# Patient Record
Sex: Female | Born: 1980 | Race: White | Hispanic: No | Marital: Single
Health system: Southern US, Community
[De-identification: ages and names within clinical notes are randomized; demographics above are authoritative.]

## PROBLEM LIST (undated history)

## (undated) ENCOUNTER — Inpatient Hospital Stay (HOSPITAL_COMMUNITY): Payer: Self-pay

---

## 2001-03-08 ENCOUNTER — Encounter: Payer: Self-pay | Admitting: Emergency Medicine

## 2001-03-08 ENCOUNTER — Emergency Department (HOSPITAL_COMMUNITY): Admission: EM | Admit: 2001-03-08 | Discharge: 2001-03-09 | Payer: Self-pay | Admitting: Emergency Medicine

## 2001-05-09 ENCOUNTER — Emergency Department (HOSPITAL_COMMUNITY): Admission: EM | Admit: 2001-05-09 | Discharge: 2001-05-09 | Payer: Self-pay

## 2001-10-24 ENCOUNTER — Other Ambulatory Visit: Admission: RE | Admit: 2001-10-24 | Discharge: 2001-10-24 | Payer: Self-pay | Admitting: Obstetrics and Gynecology

## 2001-10-24 ENCOUNTER — Other Ambulatory Visit: Admission: RE | Admit: 2001-10-24 | Discharge: 2001-10-24 | Payer: Self-pay | Admitting: *Deleted

## 2002-04-05 ENCOUNTER — Inpatient Hospital Stay (HOSPITAL_COMMUNITY): Admission: AD | Admit: 2002-04-05 | Discharge: 2002-04-07 | Payer: Self-pay | Admitting: *Deleted

## 2002-07-16 ENCOUNTER — Encounter: Admission: RE | Admit: 2002-07-16 | Discharge: 2002-07-24 | Payer: Self-pay | Admitting: Family Medicine

## 2003-05-19 ENCOUNTER — Inpatient Hospital Stay (HOSPITAL_COMMUNITY): Admission: AD | Admit: 2003-05-19 | Discharge: 2003-05-19 | Payer: Self-pay | Admitting: *Deleted

## 2003-06-27 ENCOUNTER — Emergency Department (HOSPITAL_COMMUNITY): Admission: EM | Admit: 2003-06-27 | Discharge: 2003-06-27 | Payer: Self-pay | Admitting: Emergency Medicine

## 2003-08-06 ENCOUNTER — Inpatient Hospital Stay (HOSPITAL_COMMUNITY): Admission: AD | Admit: 2003-08-06 | Discharge: 2003-08-06 | Payer: Self-pay | Admitting: Family Medicine

## 2003-08-31 ENCOUNTER — Other Ambulatory Visit: Admission: RE | Admit: 2003-08-31 | Discharge: 2003-08-31 | Payer: Self-pay | Admitting: Obstetrics and Gynecology

## 2004-01-02 ENCOUNTER — Inpatient Hospital Stay (HOSPITAL_COMMUNITY): Admission: AD | Admit: 2004-01-02 | Discharge: 2004-01-02 | Payer: Self-pay | Admitting: Obstetrics and Gynecology

## 2004-01-27 ENCOUNTER — Inpatient Hospital Stay (HOSPITAL_COMMUNITY): Admission: AD | Admit: 2004-01-27 | Discharge: 2004-01-27 | Payer: Self-pay | Admitting: Obstetrics and Gynecology

## 2004-02-01 ENCOUNTER — Inpatient Hospital Stay (HOSPITAL_COMMUNITY): Admission: AD | Admit: 2004-02-01 | Discharge: 2004-02-02 | Payer: Self-pay | Admitting: Obstetrics and Gynecology

## 2011-08-05 ENCOUNTER — Emergency Department (HOSPITAL_COMMUNITY)
Admission: EM | Admit: 2011-08-05 | Discharge: 2011-08-05 | Disposition: A | Discharge status: Home / Self Care | Payer: Self-pay | Attending: Emergency Medicine | Admitting: Emergency Medicine

## 2011-08-05 ENCOUNTER — Encounter (HOSPITAL_COMMUNITY): Payer: Self-pay | Admitting: *Deleted

## 2011-08-05 ENCOUNTER — Emergency Department (HOSPITAL_COMMUNITY): Payer: Self-pay

## 2011-08-05 DIAGNOSIS — S61209A Unspecified open wound of unspecified finger without damage to nail, initial encounter: Secondary | ICD-10-CM | POA: Insufficient documentation

## 2011-08-05 DIAGNOSIS — S6000XA Contusion of unspecified finger without damage to nail, initial encounter: Secondary | ICD-10-CM | POA: Insufficient documentation

## 2011-08-05 DIAGNOSIS — F17200 Nicotine dependence, unspecified, uncomplicated: Secondary | ICD-10-CM | POA: Insufficient documentation

## 2011-08-05 DIAGNOSIS — W208XXA Other cause of strike by thrown, projected or falling object, initial encounter: Secondary | ICD-10-CM | POA: Insufficient documentation

## 2011-08-05 DIAGNOSIS — S60029A Contusion of unspecified index finger without damage to nail, initial encounter: Secondary | ICD-10-CM

## 2011-08-05 DIAGNOSIS — S61219A Laceration without foreign body of unspecified finger without damage to nail, initial encounter: Secondary | ICD-10-CM

## 2011-08-05 NOTE — ED Provider Notes (Signed)
History     CSN: [id number]  Arrival date & time 08/05/11  1342   First MD Initiated Contact with Patient 08/05/11 1505      Chief Complaint  Patient presents with  . Finger Injury    (Consider location/radiation/quality/duration/timing/severity/associated sxs/prior treatment) HPI Comments: Patient states she was loading wood into a furnace, when a piece of wood fell and injured the right index finger. She has had pain and throbbing of the tip of the right index finger since that time. Patient tried Naprosyn for assistance with pain but this was unsuccessful. She has pain with attempting to be in the index finger. There been no previous procedures involving this finger.  The history is provided by the patient.    History reviewed. No pertinent past medical history.  History reviewed. No pertinent past surgical history.  No family history on file.  History  Substance Use Topics  . Smoking status: Current Everyday Smoker -- 0.5 packs/day for 10 years    Types: Cigarettes  . Smokeless tobacco: Not on file  . Alcohol Use: No    OB History    Grav Para Term Preterm Abortions TAB SAB Ect Mult Living   3 3              Review of Systems  Constitutional: Negative for activity change.       All ROS Neg except as noted in HPI  HENT: Negative for nosebleeds and neck pain.   Eyes: Negative for photophobia and discharge.  Respiratory: Negative for cough, shortness of breath and wheezing.   Cardiovascular: Negative for chest pain and palpitations.  Gastrointestinal: Negative for abdominal pain and blood in stool.  Genitourinary: Negative for dysuria, frequency and hematuria.  Musculoskeletal: Negative for back pain and arthralgias.  Skin: Negative.   Neurological: Negative for dizziness, seizures and speech difficulty.  Psychiatric/Behavioral: Negative for hallucinations and confusion.    Allergies  Review of patient's allergies indicates no known allergies.  Home  Medications   Current Outpatient Rx  Name Route Sig Dispense Refill  . NAPROXEN SODIUM 220 MG PO TABS Oral Take 220 mg by mouth every 8 (eight) hours as needed. For headache      BP 114/75  Pulse 115  Temp(Src) 98.5 F (36.9 C) (Oral)  Resp 18  Ht 5\' 6"  (1.676 m)  Wt 123 lb (55.792 kg)  BMI 19.85 kg/m2  SpO2 100%  LMP 07/25/2011  Physical Exam  Nursing note and vitals reviewed. Constitutional: She is oriented to person, place, and time. She appears well-developed and well-nourished.  Non-toxic appearance.  HENT:  Head: Normocephalic.  Right Ear: Tympanic membrane and external ear normal.  Left Ear: Tympanic membrane and external ear normal.  Eyes: EOM and lids are normal. Pupils are equal, round, and reactive to light.  Neck: Normal range of motion. Neck supple. Carotid bruit is not present.  Cardiovascular: Normal rate, regular rhythm, normal heart sounds, intact distal pulses and normal pulses.   Pulmonary/Chest: Breath sounds normal. No respiratory distress.  Abdominal: Soft. Bowel sounds are normal. There is no tenderness. There is no guarding.  Musculoskeletal: Normal range of motion.       Patient has decreased range of motion of the distal right index finger due to pain. There is full range of motion of the MP joint, wrist, elbow, and right shoulder. There is good capillary refill of the index finger. There is a shallow laceration on the palmar surface of the mid right index finger, bleeding is  controlled.  Lymphadenopathy:       Head (right side): No submandibular adenopathy present.       Head (left side): No submandibular adenopathy present.    She has no cervical adenopathy.  Neurological: She is alert and oriented to person, place, and time. She has normal strength. No cranial nerve deficit or sensory deficit.  Skin: Skin is warm and dry.  Psychiatric: She has a normal mood and affect. Her speech is normal.    ED Course  Procedures (including critical care  time)  Labs Reviewed - No data to display Dg Finger Index Right  08/05/2011  *RADIOLOGY REPORT*  Clinical Data: 31 year old female with right index finger pain and swelling following injury.  RIGHT INDEX FINGER 2+V  Comparison: None  Findings: No evidence of acute fracture, subluxation or dislocation identified.  No radio-opaque foreign bodies are present.  No focal bony lesions are noted.  The joint spaces are unremarkable.  IMPRESSION: No bony abnormalities.  Original Report Authenticated By: Rosendo Gros, M.D.     1. Contusion of index finger   2. Laceration of finger       MDM  I have reviewed nursing notes, vital signs, and all appropriate lab and imaging results for this patient. The x-ray of the right index finger is negative for fracture or dislocation. The patient is fitted with a finger splint. Patient is to continue her naproxen. He is to see orthopedics if finger is not improving. The laceration to the palmar surface of the right index finger is a shallow flap-type laceration. It is not a candidate for suture repair at this time. Sterile bandage applied.       Kathie Dike, PA 08/05/11 1516  Kathie Dike, PA 08/05/11 1517

## 2011-08-05 NOTE — ED Notes (Signed)
Pt states "crushed rt index finger while putting wood in furnace"  Pt's rt index finger is bruised with minimal swelling and decreased range of motion. Pt also has a dime sized open laceration on finger. No s/s of infection noted. Bleeding under control.

## 2011-08-05 NOTE — ED Notes (Signed)
Crush injury to Rt index finger.

## 2011-08-05 NOTE — Discharge Instructions (Signed)
Your x-ray is negative for fracture or dislocation. Please use the finger splint for the next 7-10 days. Please see the orthopedist listed above if you begin to have problems with your finger.Contusion A contusion is a deep bruise. Contusions happen when an injury causes bleeding under the skin. Signs of bruising include pain, puffiness (swelling), and discolored skin. The contusion may turn blue, purple, or yellow. HOME CARE   Put ice on the injured area.   Put ice in a plastic bag.   Place a towel between your skin and the bag.   Leave the ice on for 15 to 20 minutes, 3 to 4 times a day.   Only take medicine as told by your doctor.   Rest the injured area.   If possible, raise (elevate) the injured area to lessen puffiness.  GET HELP RIGHT AWAY IF:   You have more bruising or puffiness.   You have pain that is getting worse.   Your puffiness or pain is not helped by medicine.  MAKE SURE YOU:   Understand these instructions.   Will watch your condition.   Will get help right away if you are not doing well or get worse.  Document Released: 11/01/2007 Document Revised: 05/04/2011 Document Reviewed: 03/20/2011 [facility] Patient Information 2012 [place], [place].

## 2011-08-05 NOTE — ED Provider Notes (Signed)
Medical screening examination/treatment/procedure(s) were performed by non-physician practitioner and as supervising physician I was immediately available for consultation/collaboration.  Hurman Horn, MD 08/05/11 [phone]

## 2013-02-12 IMAGING — CR DG FINGER INDEX 2+V*R*
2 series · 2 of 2 positions shown · non-contrast
Comparison: None

CLINICAL DATA: 30-year-old female with right index finger pain and
swelling following injury.

RIGHT INDEX FINGER 2+V

[view not recorded (1 of 2)]
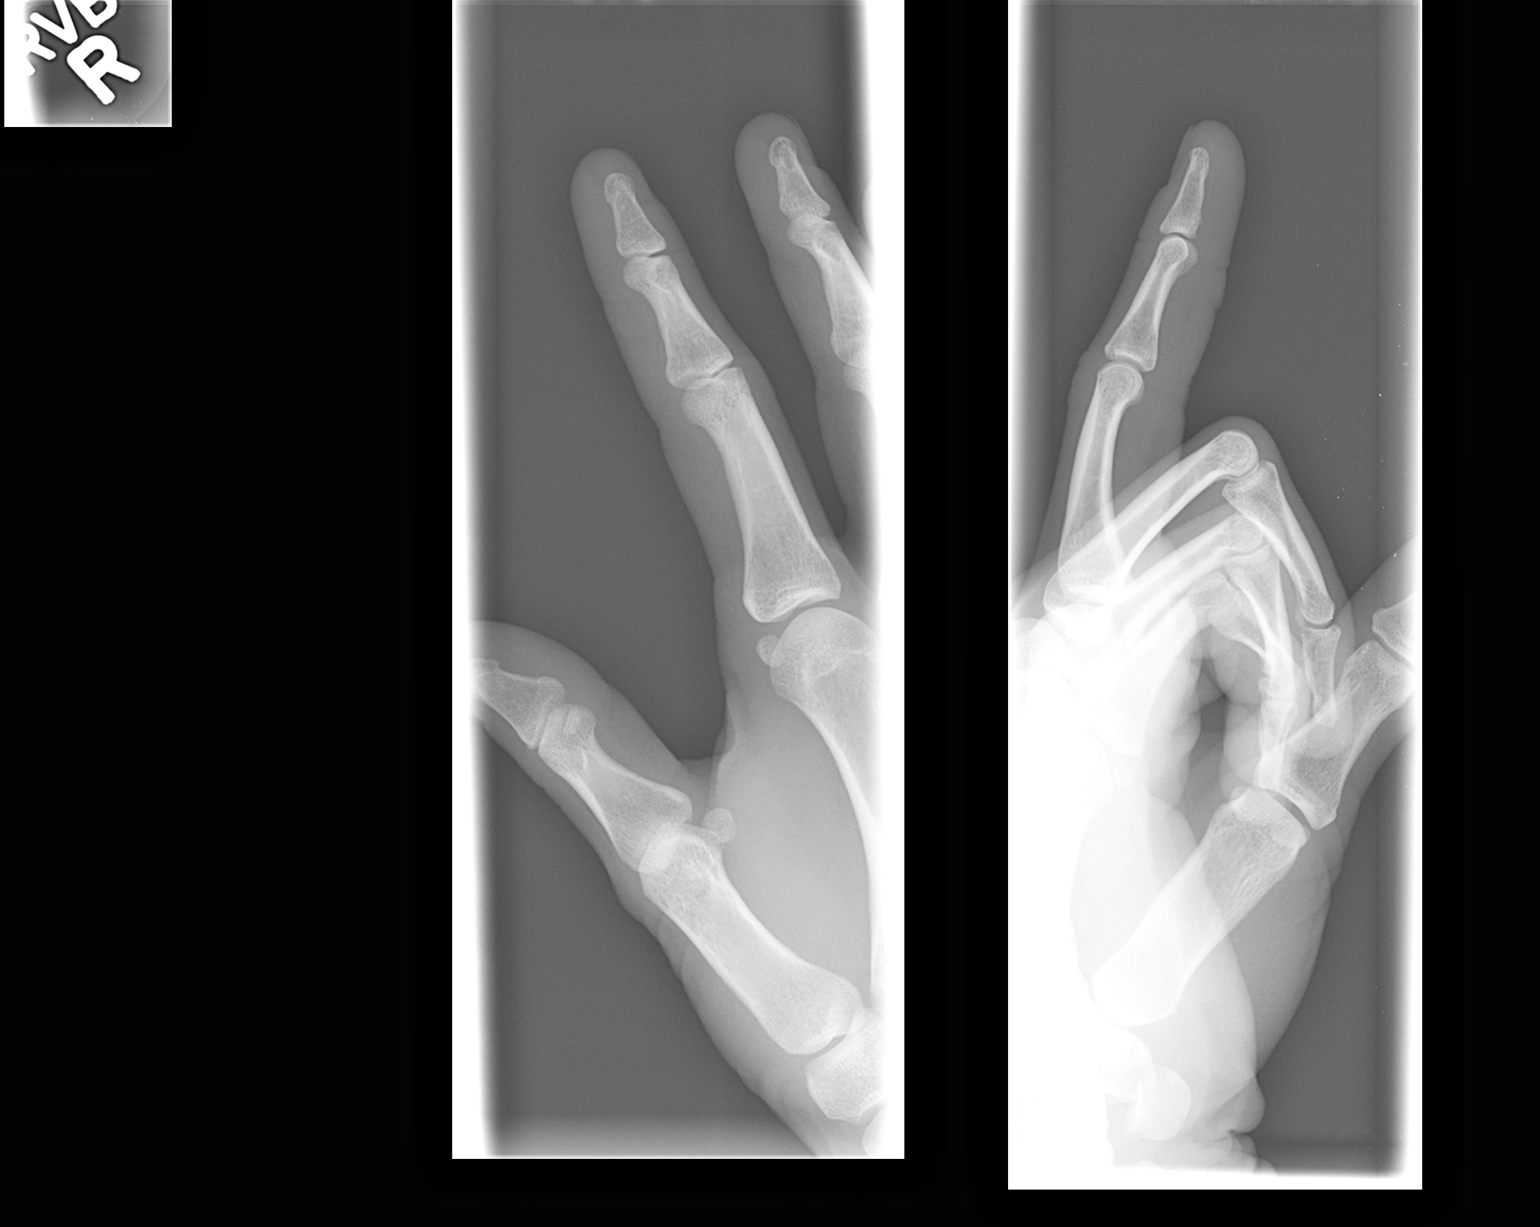

[view not recorded (2 of 2)]
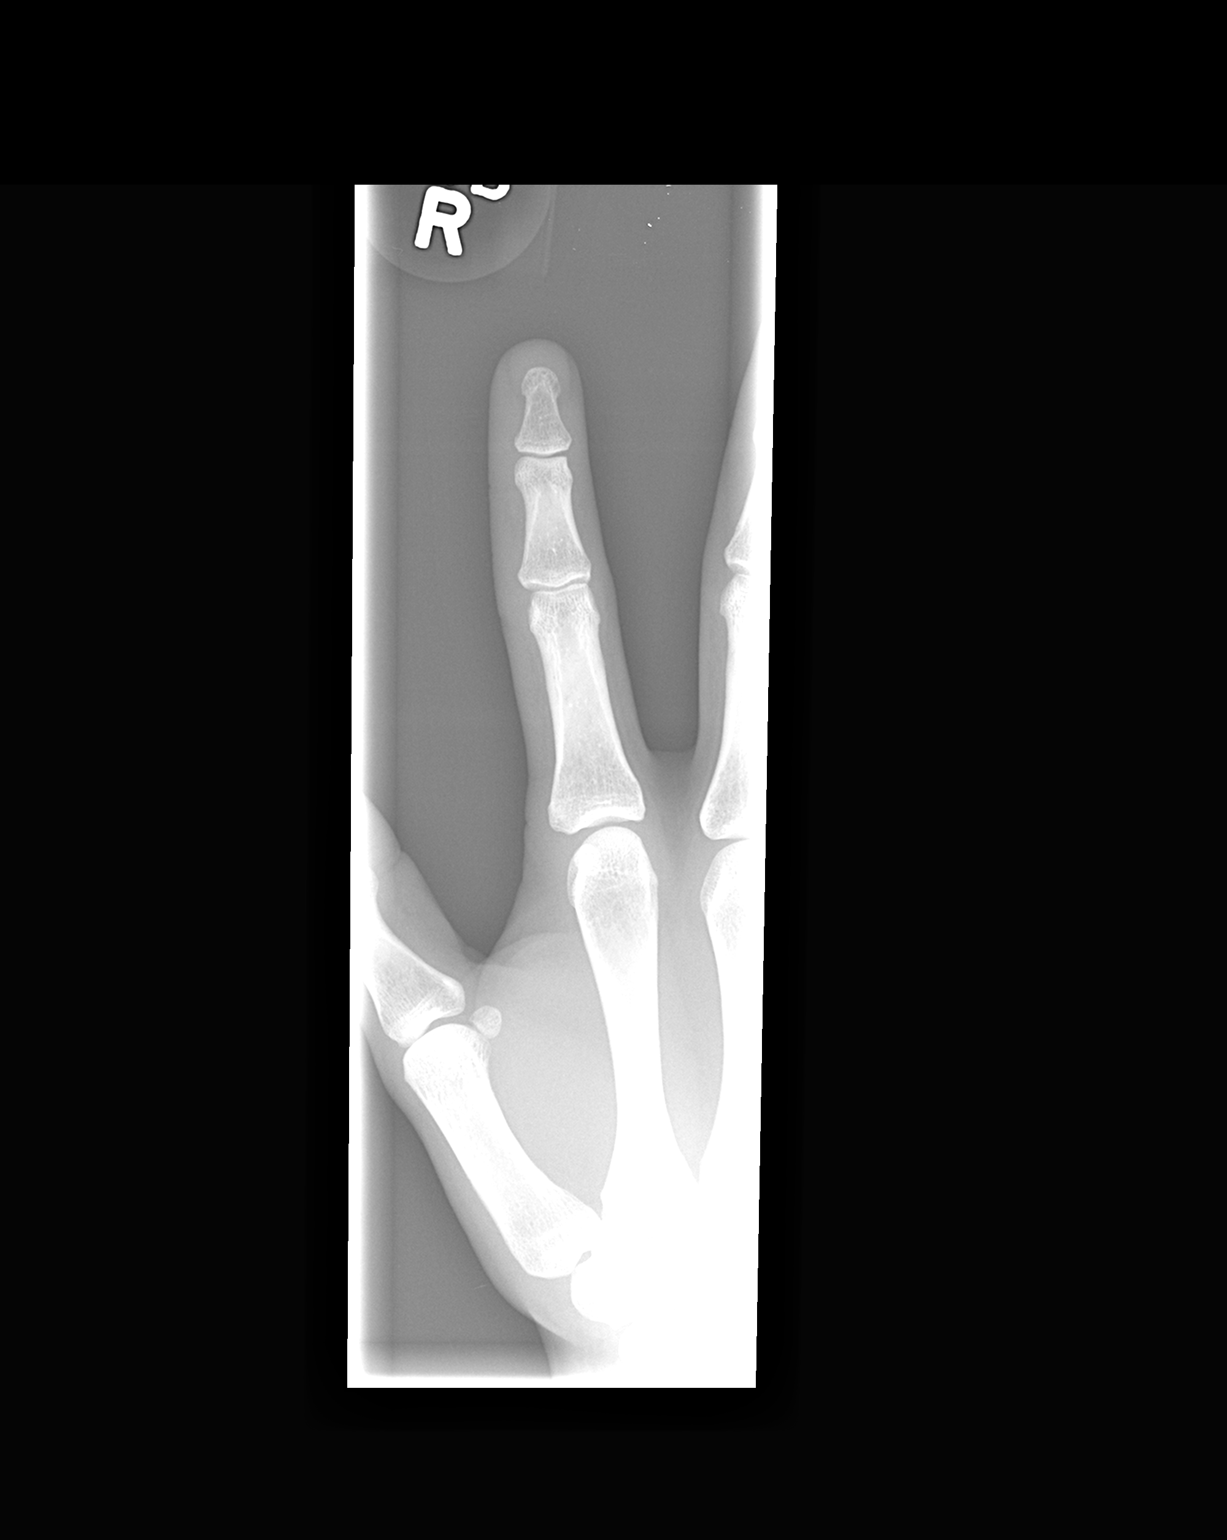

[2 of 2 positions shown; findings below may reference images not displayed]

FINDINGS: No evidence of acute fracture, subluxation or dislocation
identified.

No radio-opaque foreign bodies are present.

No focal bony lesions are noted.

The joint spaces are unremarkable.
IMPRESSION: No bony abnormalities.

## 2013-04-26 ENCOUNTER — Encounter (HOSPITAL_COMMUNITY): Payer: Self-pay | Admitting: Emergency Medicine

## 2013-04-26 ENCOUNTER — Emergency Department (HOSPITAL_COMMUNITY)
Admission: EM | Admit: 2013-04-26 | Discharge: 2013-04-26 | Disposition: A | Discharge status: Home / Self Care | Payer: Self-pay | Attending: Emergency Medicine | Admitting: Emergency Medicine

## 2013-04-26 DIAGNOSIS — Z792 Long term (current) use of antibiotics: Secondary | ICD-10-CM | POA: Insufficient documentation

## 2013-04-26 DIAGNOSIS — K047 Periapical abscess without sinus: Secondary | ICD-10-CM | POA: Insufficient documentation

## 2013-04-26 DIAGNOSIS — F17200 Nicotine dependence, unspecified, uncomplicated: Secondary | ICD-10-CM | POA: Insufficient documentation

## 2013-04-26 DIAGNOSIS — K029 Dental caries, unspecified: Secondary | ICD-10-CM | POA: Insufficient documentation

## 2013-04-26 MED ORDER — TRAMADOL HCL 50 MG PO TABS: 50.0000 mg | ORAL_TABLET | Freq: Four times a day (QID) | ORAL | Status: DC | PRN

## 2013-04-26 MED ORDER — AMOXICILLIN 500 MG PO CAPS: 500.0000 mg | ORAL_CAPSULE | Freq: Three times a day (TID) | ORAL | Status: DC

## 2013-04-26 NOTE — ED Notes (Signed)
Pain in right lower jaw, needs work note

## 2013-04-28 NOTE — ED Provider Notes (Signed)
CSN: [id number]     Arrival date & time 04/26/13  1359 History   First MD Initiated Contact with Patient 04/26/13 1421     Chief Complaint  Patient presents with  . Dental Pain   (Consider location/radiation/quality/duration/timing/severity/associated sxs/prior Treatment) HPI Comments: Joyce Roman is a 32 y.o. Female with a 2 day history of dental pain and gingival swelling.   The patient has a history of a deepening cavity in the tooth involved.  There has been no fevers,  Chills, nausea or vomiting, also no complaint of difficulty swallowing,  Although chewing makes pain worse.  The patient has tried naproxen without relief of symptoms.         The history is provided by the patient.    History reviewed. No pertinent past medical history. History reviewed. No pertinent past surgical history. No family history on file. History  Substance Use Topics  . Smoking status: Current Every Day Smoker -- 0.50 packs/day for 10 years    Types: Cigarettes  . Smokeless tobacco: Not on file  . Alcohol Use: No   OB History   Grav Para Term Preterm Abortions TAB SAB Ect Mult Living   3 3             Review of Systems  Constitutional: Negative for fever.  HENT: Positive for dental problem. Negative for facial swelling and sore throat.   Respiratory: Negative for shortness of breath.   Musculoskeletal: Negative for neck pain and neck stiffness.    Allergies  Review of patient's allergies indicates no known allergies.  Home Medications   Current Outpatient Rx  Name  Route  Sig  Dispense  Refill  . amoxicillin (AMOXIL) 500 MG capsule   Oral   Take 1 capsule (500 mg total) by mouth 3 (three) times daily.   30 capsule   0   . naproxen sodium (ANAPROX) 220 MG tablet   Oral   Take 220 mg by mouth every 8 (eight) hours as needed. For headache         . traMADol (ULTRAM) 50 MG tablet   Oral   Take 1 tablet (50 mg total) by mouth every 6 (six) hours as needed.   15 tablet    0    BP 100/64  Pulse 87  Temp(Src) 98.3 F (36.8 C)  Resp 20  Ht 5\' 6"  (1.676 m)  Wt 130 lb (58.968 kg)  BMI 20.99 kg/m2  SpO2 98%  LMP 04/05/2013 Physical Exam  Constitutional: She is oriented to person, place, and time. She appears well-developed and well-nourished. No distress.  HENT:  Head: Normocephalic and atraumatic.  Right Ear: Tympanic membrane and external ear normal.  Left Ear: Tympanic membrane and external ear normal.  Nose: Nose normal.  Mouth/Throat: Oropharynx is clear and moist and mucous membranes are normal. No oral lesions. No trismus in the jaw. Dental abscesses and dental caries present.    Deep cavity right lower 2nd molar.  Surrounding gingival erythema without edema.  No facial edema.  Eyes: Conjunctivae are normal.  Neck: Normal range of motion. Neck supple.  Cardiovascular: Normal rate and normal heart sounds.   Pulmonary/Chest: Effort normal.  Abdominal: She exhibits no distension.  Musculoskeletal: Normal range of motion.  Lymphadenopathy:       Head (right side): Submental adenopathy present.    She has no cervical adenopathy.  Neurological: She is alert and oriented to person, place, and time.  Skin: Skin is warm and dry. No erythema.  Psychiatric: She has a normal mood and affect.    ED Course  Procedures (including critical care time) Labs Review Labs Reviewed - No data to display Imaging Review No results found.  EKG Interpretation   None       MDM   1. Dental abscess    Pt prescribed amoxil, tramadol, encouraged f/u with dentist, referrals given.  The patient appears reasonably screened and/or stabilized for discharge and I doubt any other medical condition or other [facility] requiring further screening, evaluation, or treatment in the ED at this time prior to discharge.     Burgess Amor, PA-C 04/28/13 1242

## 2013-04-29 NOTE — ED Provider Notes (Signed)
Medical screening examination/treatment/procedure(s) were performed by non-physician practitioner and as supervising physician I was immediately available for consultation/collaboration.     Geoffery Lyons, MD 04/29/13 [phone]

## 2013-09-10 ENCOUNTER — Encounter (HOSPITAL_COMMUNITY): Payer: Self-pay | Admitting: Emergency Medicine

## 2013-09-10 ENCOUNTER — Emergency Department (HOSPITAL_COMMUNITY)
Admission: EM | Admit: 2013-09-10 | Discharge: 2013-09-10 | Disposition: A | Discharge status: Home / Self Care | Payer: Self-pay | Attending: Emergency Medicine | Admitting: Emergency Medicine

## 2013-09-10 DIAGNOSIS — Z792 Long term (current) use of antibiotics: Secondary | ICD-10-CM | POA: Insufficient documentation

## 2013-09-10 DIAGNOSIS — S90569A Insect bite (nonvenomous), unspecified ankle, initial encounter: Secondary | ICD-10-CM | POA: Insufficient documentation

## 2013-09-10 DIAGNOSIS — Y929 Unspecified place or not applicable: Secondary | ICD-10-CM | POA: Insufficient documentation

## 2013-09-10 DIAGNOSIS — W57XXXA Bitten or stung by nonvenomous insect and other nonvenomous arthropods, initial encounter: Secondary | ICD-10-CM | POA: Insufficient documentation

## 2013-09-10 DIAGNOSIS — Y939 Activity, unspecified: Secondary | ICD-10-CM | POA: Insufficient documentation

## 2013-09-10 DIAGNOSIS — F17200 Nicotine dependence, unspecified, uncomplicated: Secondary | ICD-10-CM | POA: Insufficient documentation

## 2013-09-10 DIAGNOSIS — Z79899 Other long term (current) drug therapy: Secondary | ICD-10-CM | POA: Insufficient documentation

## 2013-09-10 DIAGNOSIS — S40269A Insect bite (nonvenomous) of unspecified shoulder, initial encounter: Secondary | ICD-10-CM | POA: Insufficient documentation

## 2013-09-10 MED ORDER — SULFAMETHOXAZOLE-TRIMETHOPRIM 800-160 MG PO TABS: 1.0000 | ORAL_TABLET | Freq: Two times a day (BID) | ORAL | Status: DC

## 2013-09-10 MED ORDER — TRIAMCINOLONE ACETONIDE 0.1 % EX CREA: 1.0000 "application " | TOPICAL_CREAM | Freq: Three times a day (TID) | CUTANEOUS | Status: DC

## 2013-09-10 NOTE — Discharge Instructions (Signed)
Insect Bite °Mosquitoes, flies, fleas, bedbugs, and other insects can bite. Insect bites are different from insect stings. The bite may be red, puffy (swollen), and itchy for 2 to 4 days. Most bites get better on their own. °HOME CARE  °· Do not scratch the bite. °· Keep the bite clean and dry. Wash the bite with soap and water. °· Put ice on the bite. °· Put ice in a plastic bag. °· Place a towel between your skin and the bag. °· Leave the ice on for 20 minutes, 4 times a day. Do this for the first 2 to 3 days, or as told by your doctor. °· You may use medicated lotions or creams to lessen itching as told by your doctor. °· Only take medicines as told by your doctor. °· If you are given medicines (antibiotics), take them as told. Finish them even if you start to feel better. °You may need a tetanus shot if: °· You cannot remember when you had your last tetanus shot. °· You have never had a tetanus shot. °· The injury broke your skin. °If you need a tetanus shot and you choose not to have one, you may get tetanus. Sickness from tetanus can be serious. °GET HELP RIGHT AWAY IF:  °· You have more pain, redness, or puffiness. °· You see a red line on the skin coming from the bite. °· You have a fever. °· You have joint pain. °· You have a headache or neck pain. °· You feel weak. °· You have a rash. °· You have chest pain, or you are short of breath. °· You have belly (abdominal) pain. °· You feel sick to your stomach (nauseous) or throw up (vomit). °· You feel very tired or sleepy. °MAKE SURE YOU:  °· Understand these instructions. °· Will watch your condition. °· Will get help right away if you are not doing well or get worse. °Document Released: 05/12/2000 Document Revised: 08/07/2011 Document Reviewed: 12/14/2010 °[employer]® Patient Information ©2014 [employer]. ° °

## 2013-09-10 NOTE — ED Notes (Signed)
Pt states noticed bug bite on rt flank area yesterday, started small and now it appears red, hot, swollen and approximately 3.5 inches across in an uneven circular shape. Pt noticed another spot on rt upper inside of right arm.

## 2013-09-12 NOTE — ED Provider Notes (Signed)
CSN: [id number]     Arrival date & time 09/10/13  1408 History   First MD Initiated Contact with Patient 09/10/13 1739     Chief Complaint  Patient presents with  . Insect Bite     (Consider location/radiation/quality/duration/timing/severity/associated sxs/prior Treatment) HPI Comments: Joyce Roman is a 33 y.o. female who presents to the Emergency Department complaining of itching and redness to the right anterior hip and inner aspect of the right upper arm.  Sx's began one day prior to ED arrival.  Patient believes she may have been bitten by an insect although she does not recall anything.  She states the redness to her hip area is worsening.  She has applied hydrocortisone cream w/o relief.  She denies pain, fever, chills, nausea or vomiting.    The history is provided by the patient.    History reviewed. No pertinent past medical history. History reviewed. No pertinent past surgical history. History reviewed. No pertinent family history. History  Substance Use Topics  . Smoking status: Current Every Day Smoker -- 0.50 packs/day for 10 years    Types: Cigarettes  . Smokeless tobacco: Not on file  . Alcohol Use: No   OB History   Grav Para Term Preterm Abortions TAB SAB Ect Mult Living   3 3             Review of Systems  Constitutional: Negative for fever, chills, activity change and appetite change.  HENT: Negative for facial swelling, sore throat and trouble swallowing.   Respiratory: Negative for chest tightness, shortness of breath and wheezing.   Musculoskeletal: Negative for neck pain and neck stiffness.  Skin: Positive for color change and rash. Negative for wound.  Neurological: Negative for dizziness, weakness, numbness and headaches.  All other systems reviewed and are negative.     Allergies  Review of patient's allergies indicates no known allergies.  Home Medications   Prior to Admission medications   Medication Sig Start Date End Date Taking?  Authorizing Provider  amoxicillin (AMOXIL) 500 MG capsule Take 1 capsule (500 mg total) by mouth 3 (three) times daily. 04/26/13   Burgess AmorJulie Idol, PA-C  naproxen sodium (ANAPROX) 220 MG tablet Take 220 mg by mouth every 8 (eight) hours as needed. For headache    Historical Provider, MD  sulfamethoxazole-trimethoprim (SEPTRA DS) 800-160 MG per tablet Take 1 tablet by mouth 2 (two) times daily. For 10 days 09/10/13   Kevork Joyce L. Darrik Richman, PA-C  traMADol (ULTRAM) 50 MG tablet Take 1 tablet (50 mg total) by mouth every 6 (six) hours as needed. 04/26/13   Burgess AmorJulie Idol, PA-C  triamcinolone cream (KENALOG) 0.1 % Apply 1 application topically 3 (three) times daily. 09/10/13   Gamble Enderle L. Galileo Colello, PA-C   BP 115/72  Pulse 75  Temp(Src) 98.3 F (36.8 C) (Oral)  Resp 18  Ht 5\' 6"  (1.676 m)  Wt 130 lb (58.968 kg)  BMI 20.99 kg/m2  SpO2 100% Physical Exam  Nursing note and vitals reviewed. Constitutional: She is oriented to person, place, and time. She appears well-developed and well-nourished. No distress.  HENT:  Head: Normocephalic and atraumatic.  Mouth/Throat: Oropharynx is clear and moist.  Neck: Normal range of motion. Neck supple.  Cardiovascular: Normal rate, regular rhythm, normal heart sounds and intact distal pulses.   No murmur heard. Pulmonary/Chest: Effort normal and breath sounds normal. No respiratory distress. She has no wheezes.  Musculoskeletal: She exhibits no edema and no tenderness.  Lymphadenopathy:    She has no  cervical adenopathy.  Neurological: She is alert and oriented to person, place, and time. She exhibits normal muscle tone. Coordination normal.  Skin: Skin is warm. Rash noted. There is erythema.  Localized large area of erythema to the anterior right hip.  Small puncture , slightly blanched area within the center.  No target lesions, petechiae, pustule, or induration.  Similar appearing , quarter sized area to the right upper arm as well.      ED Course  Procedures  (including critical care time) Labs Review Labs Reviewed - No data to display  Imaging Review No results found.   EKG Interpretation None      MDM   Final diagnoses:  Insect bite    Localized erythema to the right hip and upper arm appear c/w insect bites.  Pt agrees to symptomatic treatment with triamcinolone cream and benadryl.  Given significant size of the redness to the hip, I will prescribe bactrim also for possible early developing abscess.  Pt is otherwise well appearing and agrees to plan.  Advised to return if sx's worsen.  Pt stable for d/c    Treyshon Buchanon L. Trisha Mangleriplett, PA-C 09/12/13 2109

## 2013-09-13 NOTE — ED Provider Notes (Signed)
Medical screening examination/treatment/procedure(s) were performed by non-physician practitioner and as supervising physician I was immediately available for consultation/collaboration.   EKG Interpretation None        Rolland PorterMark Jatorian Renault, MD 09/13/13 [phone]

## 2013-11-07 ENCOUNTER — Emergency Department (HOSPITAL_COMMUNITY)
Admission: EM | Admit: 2013-11-07 | Discharge: 2013-11-07 | Disposition: A | Discharge status: Home / Self Care | Payer: Self-pay | Attending: Emergency Medicine | Admitting: Emergency Medicine

## 2013-11-07 ENCOUNTER — Encounter (HOSPITAL_COMMUNITY): Payer: Self-pay | Admitting: Emergency Medicine

## 2013-11-07 DIAGNOSIS — K529 Noninfective gastroenteritis and colitis, unspecified: Secondary | ICD-10-CM

## 2013-11-07 DIAGNOSIS — F17200 Nicotine dependence, unspecified, uncomplicated: Secondary | ICD-10-CM | POA: Insufficient documentation

## 2013-11-07 DIAGNOSIS — Z79899 Other long term (current) drug therapy: Secondary | ICD-10-CM | POA: Insufficient documentation

## 2013-11-07 DIAGNOSIS — K5289 Other specified noninfective gastroenteritis and colitis: Secondary | ICD-10-CM | POA: Insufficient documentation

## 2013-11-07 DIAGNOSIS — Z3202 Encounter for pregnancy test, result negative: Secondary | ICD-10-CM | POA: Insufficient documentation

## 2013-11-07 LAB — CBC WITH DIFFERENTIAL/PLATELET
BASOS ABS: 0.1 10*3/uL (ref 0.0–0.1)
BASOS PCT: 1 % (ref 0–1)
EOS ABS: 0.1 10*3/uL (ref 0.0–0.7)
EOS PCT: 3 % (ref 0–5)
HEMATOCRIT: 42.7 % (ref 36.0–46.0)
HEMOGLOBIN: 15 g/dL (ref 12.0–15.0)
Lymphocytes Relative: 39 % (ref 12–46)
Lymphs Abs: 2 10*3/uL (ref 0.7–4.0)
MCH: 32.1 pg (ref 26.0–34.0)
MCHC: 35.1 g/dL (ref 30.0–36.0)
MCV: 91.4 fL (ref 78.0–100.0)
MONO ABS: 0.4 10*3/uL (ref 0.1–1.0)
MONOS PCT: 7 % (ref 3–12)
Neutro Abs: 2.6 10*3/uL (ref 1.7–7.7)
Neutrophils Relative %: 50 % (ref 43–77)
Platelets: 170 10*3/uL (ref 150–400)
RBC: 4.67 MIL/uL (ref 3.87–5.11)
RDW: 12.4 % (ref 11.5–15.5)
WBC: 5.1 10*3/uL (ref 4.0–10.5)

## 2013-11-07 LAB — COMPREHENSIVE METABOLIC PANEL
ALK PHOS: 65 U/L (ref 39–117)
ALT: 17 U/L (ref 0–35)
AST: 51 U/L — AB (ref 0–37)
Albumin: 3.7 g/dL (ref 3.5–5.2)
BUN: 6 mg/dL (ref 6–23)
CALCIUM: 9.3 mg/dL (ref 8.4–10.5)
CO2: 25 mEq/L (ref 19–32)
Chloride: 101 mEq/L (ref 96–112)
Creatinine, Ser: 0.7 mg/dL (ref 0.50–1.10)
GFR calc non Af Amer: >90 mL/min (ref >90)
GLUCOSE: 86 mg/dL (ref 70–99)
POTASSIUM: 3.1 meq/L — AB (ref 3.7–5.3)
SODIUM: 139 meq/L (ref 137–147)
TOTAL PROTEIN: 6.8 g/dL (ref 6.0–8.3)
Total Bilirubin: 0.2 mg/dL — ABNORMAL LOW (ref 0.3–1.2)

## 2013-11-07 LAB — URINALYSIS, ROUTINE W REFLEX MICROSCOPIC
GLUCOSE, UA: NEGATIVE mg/dL
HGB URINE DIPSTICK: NEGATIVE
Ketones, ur: 15 mg/dL — AB
Leukocytes, UA: NEGATIVE
Nitrite: NEGATIVE
PROTEIN: NEGATIVE mg/dL
Specific Gravity, Urine: 1.015 (ref 1.005–1.030)
UROBILINOGEN UA: 0.2 mg/dL (ref 0.0–1.0)
pH: 6 (ref 5.0–8.0)

## 2013-11-07 LAB — LIPASE, BLOOD: Lipase: 18 U/L (ref 11–59)

## 2013-11-07 LAB — PREGNANCY, URINE: PREG TEST UR: NEGATIVE

## 2013-11-07 MED ORDER — SODIUM CHLORIDE 0.9 % IV BOLUS (SEPSIS)
1000.0000 mL | Freq: Once | INTRAVENOUS | Status: AC
  Administered 2013-11-07: 1000 mL via INTRAVENOUS

## 2013-11-07 MED ORDER — SODIUM CHLORIDE 0.9 % IV BOLUS (SEPSIS): 1000.0000 mL | Freq: Once | INTRAVENOUS | Status: DC

## 2013-11-07 MED ORDER — DIPHENOXYLATE-ATROPINE 2.5-0.025 MG PO TABS: 1.0000 | ORAL_TABLET | Freq: Four times a day (QID) | ORAL | Status: AC | PRN

## 2013-11-07 MED ORDER — PROMETHAZINE HCL 25 MG PO TABS: 25.0000 mg | ORAL_TABLET | Freq: Four times a day (QID) | ORAL | Status: AC | PRN

## 2013-11-07 MED ORDER — POTASSIUM CHLORIDE CRYS ER 20 MEQ PO TBCR
40.0000 meq | EXTENDED_RELEASE_TABLET | Freq: Once | ORAL | Status: AC
  Administered 2013-11-07: 40 meq via ORAL
  Filled 2013-11-07: qty 2

## 2013-11-07 NOTE — Care Management Note (Signed)
ED/CM noted patient did not have health insurance and/or PCP listed in the computer.  Patient was given the [place] resource handout with information on the clinics, food pantries, and the handout for new health insurance sign-up.  Patient expressed appreciation for information received. 

## 2013-11-07 NOTE — ED Provider Notes (Signed)
CSN: [id number]     Arrival date & time 11/07/13  1406 History  This chart was scribed for Donnetta HutchingBrian Cameo Shewell, MD by Shari HeritageAisha Amuda, ED Scribe. The patient was seen in room APA12/APA12. Patient's care was started at 2:43 PM.   Chief Complaint  Patient presents with  . Abdominal Pain    The history is provided by the patient. No language interpreter was used.    HPI Comments: Joyce Roman is a 33 y.o. female who presents to the Emergency Department complaining of constant, moderate periumbilical and left lateral abdominal pain that began 6 days ago. She reports 2 episodes of emesis and 3 episodes of watery diarrhea in the past 24 hours. She states she has been drinking Gatorade and eating saltine crackers, but she has been unable to tolerate other food or fluids. She denies pain in the suprapubic area or back. She denies hematochezia, vaginal bleeding or discharge. LNMP was 11/06/13. She has no chronic medical conditions.    History reviewed. No pertinent past medical history. History reviewed. No pertinent past surgical history. History reviewed. No pertinent family history. History  Substance Use Topics  . Smoking status: Current Every Day Smoker -- 0.50 packs/day for 10 years    Types: Cigarettes  . Smokeless tobacco: Not on file  . Alcohol Use: No   OB History   Grav Para Term Preterm Abortions TAB SAB Ect Mult Living   3 3             Review of Systems A complete 10 system review of systems was obtained and all systems are negative except as noted in the HPI and PMH.    Allergies  Review of patient's allergies indicates no known allergies.  Home Medications   Prior to Admission medications   Medication Sig Start Date End Date Taking? Authorizing Provider  etonogestrel (IMPLANON) 68 MG IMPL implant Inject 1 each into the skin once.   Yes Historical Provider, MD  diphenoxylate-atropine (LOMOTIL) 2.5-0.025 MG per tablet Take 1 tablet by mouth 4 (four) times daily as needed for  diarrhea or loose stools. 11/07/13   Donnetta HutchingBrian Miriya Cloer, MD  promethazine (PHENERGAN) 25 MG tablet Take 1 tablet (25 mg total) by mouth every 6 (six) hours as needed. 11/07/13   Donnetta HutchingBrian Kahla Risdon, MD   Triage Vitals: BP 102/68  Pulse 97  Temp(Src) 98.1 F (36.7 C) (Oral)  Resp 17  Ht 5\' 6"  (1.676 m)  Wt 125 lb (56.7 kg)  BMI 20.19 kg/m2  SpO2 97%  LMP 11/03/2013 Physical Exam  Nursing note and vitals reviewed. Constitutional: She is oriented to person, place, and time. She appears well-developed and well-nourished.  HENT:  Head: Normocephalic and atraumatic.  Eyes: Conjunctivae and EOM are normal. Pupils are equal, round, and reactive to light.  Neck: Normal range of motion. Neck supple.  Cardiovascular: Normal rate, regular rhythm and normal heart sounds.   Pulmonary/Chest: Effort normal and breath sounds normal.  Abdominal: Soft. Bowel sounds are normal. There is tenderness.  Minimal tenderness in left lateral abdomen and periumbilical region.  Musculoskeletal: Normal range of motion.  Neurological: She is alert and oriented to person, place, and time.  Skin: Skin is warm and dry.  Psychiatric: She has a normal mood and affect. Her behavior is normal.    ED Course  Procedures (including critical care time) DIAGNOSTIC STUDIES: Oxygen Saturation is 97% on room air, adequate by my interpretation.    COORDINATION OF CARE: 2:47 PM- Will order labs and IV fluids.  Patient informed of current plan for treatment and evaluation and agrees with plan at this time.   Labs Review Labs Reviewed  COMPREHENSIVE METABOLIC PANEL - Abnormal; Notable for the following:    Potassium 3.1 (*)    AST 51 (*)    Total Bilirubin 0.2 (*)    All other components within normal limits  URINALYSIS, ROUTINE W REFLEX MICROSCOPIC - Abnormal; Notable for the following:    Bilirubin Urine SMALL (*)    Ketones, ur 15 (*)    All other components within normal limits  CBC WITH DIFFERENTIAL  LIPASE, BLOOD  PREGNANCY,  URINE    Imaging Review No results found.   EKG Interpretation None      MDM   Final diagnoses:  Gastroenteritis    No acute abdomen. Patient feels better after IV hydration. Minimal hypokalemia noted. Discharge medications Phenergan 25 mg and Lomotil.  I personally performed the services described in this documentation, which was scribed in my presence. The recorded information has been reviewed and is accurate.    Donnetta HutchingBrian Tawnee Clegg, MD 11/08/13 1721

## 2013-11-07 NOTE — Discharge Instructions (Signed)
Medication for nausea and diarrhea. Your potassium is slightly low. Eat banana and/or orange daily.

## 2013-11-07 NOTE — ED Notes (Signed)
abd pain and diarrhea for 1 week.  Nausea, vomited last yesterday.  No fever.

## 2014-03-30 ENCOUNTER — Encounter (HOSPITAL_COMMUNITY): Payer: Self-pay | Admitting: Emergency Medicine

## 2016-03-13 ENCOUNTER — Encounter (HOSPITAL_COMMUNITY): Payer: Self-pay | Admitting: Emergency Medicine

## 2016-03-13 ENCOUNTER — Emergency Department (HOSPITAL_COMMUNITY)
Admission: EM | Admit: 2016-03-13 | Discharge: 2016-03-13 | Disposition: A | Discharge status: Home / Self Care | Payer: Self-pay | Attending: Emergency Medicine | Admitting: Emergency Medicine

## 2016-03-13 DIAGNOSIS — O200 Threatened abortion: Secondary | ICD-10-CM | POA: Insufficient documentation

## 2016-03-13 DIAGNOSIS — O99331 Smoking (tobacco) complicating pregnancy, first trimester: Secondary | ICD-10-CM | POA: Insufficient documentation

## 2016-03-13 DIAGNOSIS — Z3A01 Less than 8 weeks gestation of pregnancy: Secondary | ICD-10-CM | POA: Insufficient documentation

## 2016-03-13 DIAGNOSIS — F17210 Nicotine dependence, cigarettes, uncomplicated: Secondary | ICD-10-CM | POA: Insufficient documentation

## 2016-03-13 LAB — CBC WITH DIFFERENTIAL/PLATELET
BASOS ABS: 0 10*3/uL (ref 0.0–0.1)
BASOS PCT: 1 %
Eosinophils Absolute: 0.2 10*3/uL (ref 0.0–0.7)
Eosinophils Relative: 3 %
HEMATOCRIT: 42.1 % (ref 36.0–46.0)
HEMOGLOBIN: 14 g/dL (ref 12.0–15.0)
Lymphocytes Relative: 31 %
Lymphs Abs: 2.1 10*3/uL (ref 0.7–4.0)
MCH: 32.8 pg (ref 26.0–34.0)
MCHC: 33.3 g/dL (ref 30.0–36.0)
MCV: 98.6 fL (ref 78.0–100.0)
MONOS PCT: 8 %
Monocytes Absolute: 0.6 10*3/uL (ref 0.1–1.0)
NEUTROS ABS: 4 10*3/uL (ref 1.7–7.7)
NEUTROS PCT: 57 %
Platelets: 247 10*3/uL (ref 150–400)
RBC: 4.27 MIL/uL (ref 3.87–5.11)
RDW: 12.7 % (ref 11.5–15.5)
WBC: 7 10*3/uL (ref 4.0–10.5)

## 2016-03-13 LAB — URINALYSIS, ROUTINE W REFLEX MICROSCOPIC
Bilirubin Urine: NEGATIVE
Glucose, UA: NEGATIVE mg/dL
Hgb urine dipstick: NEGATIVE
Ketones, ur: NEGATIVE mg/dL
LEUKOCYTES UA: NEGATIVE
NITRITE: NEGATIVE
PH: 7.5 (ref 5.0–8.0)
Protein, ur: NEGATIVE mg/dL
SPECIFIC GRAVITY, URINE: 1.015 (ref 1.005–1.030)

## 2016-03-13 LAB — ABO/RH: ABO/RH(D): O POS

## 2016-03-13 LAB — HCG, QUANTITATIVE, PREGNANCY: HCG, BETA CHAIN, QUANT, S: 14 m[IU]/mL — AB (ref <5)

## 2016-03-13 NOTE — ED Triage Notes (Signed)
Pt states she is [redacted] weeks pregnant, started bright red bleeding yesterday.

## 2016-03-13 NOTE — ED Provider Notes (Signed)
[facility]-EM[facility]Y DEPT Provider Note   CSN: [id number] Arrival date & time: 03/13/16  1330     History   Chief Complaint Chief Complaint  Patient presents with  . Vaginal Bleeding    [redacted] weeks pregnant    HPI Joyce Roman is a 35 y.o. female.  HPI Patient states she was recently had a positive pregnancy test at the [facility] health Department. Estimated to be [redacted] weeks pregnant. She is a G5 P4. States she's been having light spotting of bright red blood since yesterday morning. Denies any abdominal pain or discomfort. Denies passage of tissue. No fever or chills. No nausea or vomiting. States her blood type is O+. History reviewed. No pertinent past medical history.  There are no active problems to display for this patient.   History reviewed. No pertinent surgical history.  OB History    Gravida Para Term Preterm AB Living   4 3       3    SAB TAB Ectopic Multiple Live Births                   Home Medications    Prior to Admission medications   Medication Sig Start Date End Date Taking? Authorizing Provider  diphenoxylate-atropine (LOMOTIL) 2.5-0.025 MG per tablet Take 1 tablet by mouth 4 (four) times daily as needed for diarrhea or loose stools. 11/07/13   Donnetta Hutching, MD  promethazine (PHENERGAN) 25 MG tablet Take 1 tablet (25 mg total) by mouth every 6 (six) hours as needed. Patient not taking: Reported on 03/13/2016 11/07/13   Donnetta Hutching, MD    Family History Family History  Problem Relation Age of Onset  . Alcoholism Mother   . CAD Father     Social History Social History  Substance Use Topics  . Smoking status: Current Every Day Smoker    Packs/day: 1.00    Years: 10.00    Types: Cigarettes  . Smokeless tobacco: Never Used  . Alcohol use No     Allergies   Review of patient's allergies indicates no known allergies.   Review of Systems Review of Systems  Constitutional: Negative for chills and fever.  Respiratory: Negative for shortness of  breath.   Cardiovascular: Negative for chest pain.  Gastrointestinal: Negative for abdominal pain, constipation, diarrhea, nausea and vomiting.  Genitourinary: Positive for vaginal bleeding. Negative for dysuria, flank pain, frequency, hematuria, pelvic pain, vaginal discharge and vaginal pain.  Musculoskeletal: Negative for back pain, myalgias, neck pain and neck stiffness.  Skin: Negative for rash and wound.  Neurological: Negative for dizziness, weakness, light-headedness, numbness and headaches.  All other systems reviewed and are negative.    Physical Exam Updated Vital Signs BP 128/76 (BP Location: Left Arm)   Pulse 91   Temp 98.7 F (37.1 C) (Oral)   Resp 16   Ht 5\' 5"  (1.651 m)   Wt 121 lb (54.9 kg)   SpO2 100%   BMI 20.14 kg/m   Physical Exam  Constitutional: She is oriented to person, place, and time. She appears well-developed and well-nourished.  HENT:  Head: Normocephalic and atraumatic.  Mouth/Throat: Oropharynx is clear and moist.  Eyes: EOM are normal. Pupils are equal, round, and reactive to light.  Neck: Normal range of motion. Neck supple.  Cardiovascular: Normal rate and regular rhythm.  Exam reveals no gallop and no friction rub.   No murmur heard. Pulmonary/Chest: Effort normal and breath sounds normal. No respiratory distress. She has no wheezes. She has no rales.  She exhibits no tenderness.  Abdominal: Soft. Bowel sounds are normal. There is no tenderness. There is no rebound and no guarding.  Genitourinary:  Genitourinary Comments: Small amount of blood in vag vault. Cervical os is closed. No fetal tissue seen.   Musculoskeletal: Normal range of motion. She exhibits no edema or tenderness.  CVA tenderness bilaterally.  Neurological: She is alert and oriented to person, place, and time.  Moves all extremities without deficit. Sensation is fully intact.  Skin: Skin is warm and dry. Capillary refill takes less than 2 seconds. No rash noted. No erythema.    Psychiatric: She has a normal mood and affect. Her behavior is normal.  Nursing note and vitals reviewed.    ED Treatments / Results  Labs (all labs ordered are listed, but only abnormal results are displayed) Labs Reviewed  HCG, QUANTITATIVE, PREGNANCY - Abnormal; Notable for the following:       Result Value   hCG, Beta Chain, Quant, S 14 (*)    All other components within normal limits  CBC WITH DIFFERENTIAL/PLATELET  URINALYSIS, ROUTINE W REFLEX MICROSCOPIC (NOT AT [facility])  ABO/RH    EKG  EKG Interpretation None       Radiology No results found.  Procedures Procedures (including critical care time)  Medications Ordered in ED Medications - No data to display   Initial Impression / Assessment and Plan / ED Course  I have reviewed the triage vital signs and the nursing notes.  Pertinent labs & imaging results that were available during my care of the patient were reviewed by me and considered in my medical decision making (see chart for details).  Clinical Course   Discussed need to f/u with OB or ED in 2 days for recheck of hCG. Understands need to return immediately for worsening bleeding, pain, fever or any concerns.    Final Clinical Impressions(s) / ED Diagnoses   Final diagnoses:  Threatened miscarriage    New Prescriptions New Prescriptions   No medications on file     Loren Raceravid Isaura Schiller, MD 03/13/16 1555

## 2016-03-15 ENCOUNTER — Encounter (HOSPITAL_COMMUNITY): Payer: Self-pay

## 2016-03-15 ENCOUNTER — Emergency Department (HOSPITAL_COMMUNITY)
Admission: EM | Admit: 2016-03-15 | Discharge: 2016-03-15 | Disposition: A | Discharge status: Home / Self Care | Payer: Self-pay | Attending: Emergency Medicine | Admitting: Emergency Medicine

## 2016-03-15 DIAGNOSIS — F17210 Nicotine dependence, cigarettes, uncomplicated: Secondary | ICD-10-CM | POA: Insufficient documentation

## 2016-03-15 DIAGNOSIS — O99330 Smoking (tobacco) complicating pregnancy, unspecified trimester: Secondary | ICD-10-CM | POA: Insufficient documentation

## 2016-03-15 DIAGNOSIS — O039 Complete or unspecified spontaneous abortion without complication: Secondary | ICD-10-CM | POA: Insufficient documentation

## 2016-03-15 DIAGNOSIS — Z79899 Other long term (current) drug therapy: Secondary | ICD-10-CM | POA: Insufficient documentation

## 2016-03-15 LAB — HCG, QUANTITATIVE, PREGNANCY: hCG, Beta Chain, Quant, S: 6 m[IU]/mL — ABNORMAL HIGH (ref <5)

## 2016-03-15 NOTE — ED Provider Notes (Signed)
[facility] DE[facility] Provider Note   CSN: [id number] Arrival date & time: 03/15/16  1101     History   Chief Complaint Chief Complaint  Patient presents with  . Follow-up    HPI Joyce Sorrowshley I Scerbo is a 35 y.o. female.  Joyce Roman is a 35 y.o. Female who presents to the ED for repeat hcg. She was seen in the ED two days ago for vaginal spotting and was determined to have an hcg of 14. She was advised to follow up with OB in two days for repeat quant. She denies any abdominal pain currently. I explained her new hcg of 6 and patient became very upset and stated she wanted to leave. She walked out of the ER before I could explain her instructions or follow up.    The history is provided by the patient and medical records. No language interpreter was used.    History reviewed. No pertinent past medical history.  There are no active problems to display for this patient.   History reviewed. No pertinent surgical history.  OB History    Gravida Para Term Preterm AB Living   4 3       3    SAB TAB Ectopic Multiple Live Births                   Home Medications    Prior to Admission medications   Medication Sig Start Date End Date Taking? Authorizing Provider  diphenoxylate-atropine (LOMOTIL) 2.5-0.025 MG per tablet Take 1 tablet by mouth 4 (four) times daily as needed for diarrhea or loose stools. 11/07/13   Donnetta HutchingBrian Cook, MD  promethazine (PHENERGAN) 25 MG tablet Take 1 tablet (25 mg total) by mouth every 6 (six) hours as needed. Patient not taking: Reported on 03/13/2016 11/07/13   Donnetta HutchingBrian Cook, MD    Family History Family History  Problem Relation Age of Onset  . Alcoholism Mother   . CAD Father     Social History Social History  Substance Use Topics  . Smoking status: Current Every Day Smoker    Packs/day: 1.00    Years: 10.00    Types: Cigarettes  . Smokeless tobacco: Never Used  . Alcohol use No     Allergies   Review of patient's allergies indicates no known  allergies.   Review of Systems Review of Systems  Constitutional: Negative for fever.  Gastrointestinal: Negative for abdominal pain.     Physical Exam Updated Vital Signs BP 112/68 (BP Location: Left Arm)   Pulse 72   Temp 98.4 F (36.9 C) (Oral)   Resp 16   Ht 5\' 5"  (1.651 m)   Wt 54.9 kg   SpO2 99%   BMI 20.14 kg/m   Physical Exam  Constitutional: She appears well-developed and well-nourished. No distress.  HENT:  Head: Normocephalic and atraumatic.  Eyes: Right eye exhibits no discharge. Left eye exhibits no discharge.  Pulmonary/Chest: Effort normal. No respiratory distress.  Neurological: She is alert. Coordination normal.  Skin: No rash noted. She is not diaphoretic.  Psychiatric:  irritable  Nursing note and vitals reviewed.    ED Treatments / Results  Labs (all labs ordered are listed, but only abnormal results are displayed) Labs Reviewed  HCG, QUANTITATIVE, PREGNANCY - Abnormal; Notable for the following:       Result Value   hCG, Beta Chain, Quant, S 6 (*)    All other components within normal limits    EKG  EKG Interpretation None  Radiology No results found.  Procedures Procedures (including critical care time)  Medications Ordered in ED Medications - No data to display   Initial Impression / Assessment and Plan / ED Course  I have reviewed the triage vital signs and the nursing notes.  Pertinent labs & imaging results that were available during my care of the patient were reviewed by me and considered in my medical decision making (see chart for details).  Clinical Course   This is a 35 y.o. Female who presents to the ED for repeat hcg. She was seen in the ED two days ago for vaginal spotting and was determined to have an hcg of 14. She was advised to follow up with OB in two days for repeat quant. She denies any abdominal pain currently. I explained her new hcg of 6 and patient became very upset and stated she wanted to  leave. She walked out of the ER before I could explain her instructions or follow up.  I began to explain the new hCG of 6 and patient instantly became very upset and stated she was wanting to leave. I encouraged her to stay so I could explain to her follow-up and what happened. She refused and walked out of the emergency department. She stated "I want to keep this baby." Patient is O+. No need for [place]. HCG is trending down. Patient denied any abdominal pain. She refused exam or other questioning.   Final Clinical Impressions(s) / ED Diagnoses   Final diagnoses:  Miscarriage    New Prescriptions Discharge Medication List as of 03/15/2016  1:03 PM       Everlene Farrier, PA-C 03/15/16 1308    Samuel Jester, DO 03/16/16 [phone]

## 2016-03-15 NOTE — ED Triage Notes (Signed)
Pt in for repeat HCG levels. Levels were low Pt was seen Monday here. No further vaginal bleeding or cramping

## 2016-03-17 ENCOUNTER — Inpatient Hospital Stay (HOSPITAL_COMMUNITY)
Admission: AD | Admit: 2016-03-17 | Discharge: 2016-03-17 | Disposition: A | Discharge status: Home / Self Care | Payer: Self-pay | Source: Ambulatory Visit | Attending: Obstetrics & Gynecology | Admitting: Obstetrics & Gynecology

## 2016-03-17 DIAGNOSIS — Z3A00 Weeks of gestation of pregnancy not specified: Secondary | ICD-10-CM | POA: Insufficient documentation

## 2016-03-17 DIAGNOSIS — O209 Hemorrhage in early pregnancy, unspecified: Secondary | ICD-10-CM | POA: Insufficient documentation

## 2016-03-17 DIAGNOSIS — O0281 Inappropriate change in quantitative human chorionic gonadotropin (hCG) in early pregnancy: Secondary | ICD-10-CM

## 2016-03-17 DIAGNOSIS — Z711 Person with feared health complaint in whom no diagnosis is made: Secondary | ICD-10-CM

## 2016-03-17 NOTE — MAU Provider Note (Signed)
Joyce Roman is a 35 y.o. G4P3 at who presents to MAU today for a second opinion. She was seen earlier this week at another ED with vaginal bleeding after having had positive HPT. Quant HCG at that time was 14. She was instructed to return in 2 days and rpt quant was 6. Review of records indicate that pt left abruptly before plan of care could be discussed. She is here for a "second opinion". Today the patient denies abdominal pain or vaginal bleeding.   BP 113/68 (BP Location: Right Arm)   Pulse 83   Resp 16   CONSTITUTIONAL: Well-developed, well-nourished female in no acute distress.  ENT: External right and left ear normal.  EYES: EOM intact, conjunctivae normal.  MUSCULOSKELETAL: Normal range of motion.  CARDIOVASCULAR: Regular heart rate RESPIRATORY: Normal effort NEUROLOGICAL: Alert and oriented to person, place, and time.  SKIN: Skin is warm and dry. No rash noted. Not diaphoretic. No erythema. No pallor. PSYCH: Normal mood and affect. Normal behavior. Normal judgment and thought content.  No results found for this or any previous visit (from the past 24 hour(s)).  MDM Patient advised that there are no concerning symptoms today. Offered to rpt quant today although not necessary, and pt declined. I explained that with vaginal bleeding and a falling quant it was likely a failed pregnancy or chemical pregnancy. She asked about another pregnancy, I recommend she wait and attempt conception with next cycle.  A: 1. Physically well but worried    P: Discharge home Follow up with OB/GYN provider of choice Continue PNV daily Patient may return to MAU as needed or if her condition were to change or worsen   Donette LarryMelanie Dietrich Ke, CNM  03/17/2016 4:46 PM

## 2016-03-17 NOTE — MAU Note (Signed)
Was seen at [facility] on 10/16 with spotting only, denies cramping had HCG was 14, was told cervix closed.  Returned 2 days later HCG was 6.  Wants second opinion because not having bleeding.  Unsure of LMP.   No bleeding since Monday. Had Explanon removed 9/8

## 2016-07-01 ENCOUNTER — Encounter (HOSPITAL_COMMUNITY): Payer: Self-pay | Admitting: *Deleted

## 2016-07-01 ENCOUNTER — Emergency Department (HOSPITAL_COMMUNITY)
Admission: EM | Admit: 2016-07-01 | Discharge: 2016-07-27 | Disposition: E | Payer: Self-pay | Attending: Emergency Medicine | Admitting: Emergency Medicine

## 2016-07-01 DIAGNOSIS — I468 Cardiac arrest due to other underlying condition: Secondary | ICD-10-CM | POA: Insufficient documentation

## 2016-07-01 DIAGNOSIS — S20212A Contusion of left front wall of thorax, initial encounter: Secondary | ICD-10-CM | POA: Insufficient documentation

## 2016-07-01 DIAGNOSIS — Y939 Activity, unspecified: Secondary | ICD-10-CM | POA: Insufficient documentation

## 2016-07-01 DIAGNOSIS — S40022A Contusion of left upper arm, initial encounter: Secondary | ICD-10-CM | POA: Insufficient documentation

## 2016-07-01 DIAGNOSIS — S40021A Contusion of right upper arm, initial encounter: Secondary | ICD-10-CM | POA: Insufficient documentation

## 2016-07-01 DIAGNOSIS — Y999 Unspecified external cause status: Secondary | ICD-10-CM | POA: Insufficient documentation

## 2016-07-01 DIAGNOSIS — Y92410 Unspecified street and highway as the place of occurrence of the external cause: Secondary | ICD-10-CM | POA: Insufficient documentation

## 2016-07-01 LAB — PREPARE FRESH FROZEN PLASMA
BLOOD PRODUCT EXPIRATION DATE: 201802132359
Blood Product Expiration Date: 201802132359
ISSUE DATE / TIME: 201802031524
ISSUE DATE / TIME: 201802031524
UNIT TYPE AND RH: 6200
Unit Type and Rh: 6200

## 2016-07-02 ENCOUNTER — Encounter (HOSPITAL_COMMUNITY): Payer: Self-pay
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
## 2016-07-27 NOTE — Progress Notes (Signed)
   07/10/2016 1800  Clinical Encounter Type  Visited With Patient and family together;Health care provider;Other (Comment)  Visit Type Follow-up;Psychological support;Spiritual support;Social support  Referral From Nurse;Other (Comment) (Tim ME)  Consult/Referral To Chaplain  Stress Factors  Patient Stress Factors None identified  Family Stress Factors Family relationships;Lack of knowledge;Loss    Pt recently deceased, grief issues, minor children involved. Provided ministry of presence prayer and coping strategies with family. Facilitated family meeting and viewing of body.
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
## 2016-07-27 NOTE — ED Notes (Signed)
CPR paused, pulse check, no pulses. Dr. Anitra LauthPlunkett using US which shows no cardiac activity.

## 2016-07-27 NOTE — ED Notes (Signed)
Pt abd distended, good breath sounds right side, absent on left side, intubatyed per EMS enroute with 7.0 ETT.

## 2016-07-27 NOTE — ED Notes (Signed)
ME at bedside
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
## 2016-07-27 NOTE — ED Notes (Signed)
Spoke with [employer] requesting notification about patients identity.
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
## 2016-07-27 DEATH — deceased
